# Patient Record
Sex: Female | Born: 1960 | Race: White | Hispanic: No | Marital: Married | Smoking: Never smoker
Health system: Southern US, Community
[De-identification: ages and names within clinical notes are randomized; demographics above are authoritative.]

## PROBLEM LIST (undated history)

## (undated) DIAGNOSIS — J302 Other seasonal allergic rhinitis: Secondary | ICD-10-CM

## (undated) DIAGNOSIS — I10 Essential (primary) hypertension: Secondary | ICD-10-CM

## (undated) DIAGNOSIS — E119 Type 2 diabetes mellitus without complications: Secondary | ICD-10-CM

## (undated) HISTORY — PX: CHOLECYSTECTOMY: SHX55

---

## 2007-02-22 ENCOUNTER — Ambulatory Visit: Payer: Self-pay | Admitting: Family Medicine

## 2007-03-29 ENCOUNTER — Ambulatory Visit: Payer: Self-pay | Admitting: Family Medicine

## 2007-04-05 ENCOUNTER — Ambulatory Visit: Payer: Self-pay | Admitting: Family Medicine

## 2007-04-15 ENCOUNTER — Ambulatory Visit: Payer: Self-pay | Admitting: Family Medicine

## 2007-05-15 ENCOUNTER — Ambulatory Visit: Payer: Self-pay | Admitting: Family Medicine

## 2007-06-15 ENCOUNTER — Ambulatory Visit: Payer: Self-pay | Admitting: Family Medicine

## 2007-11-14 ENCOUNTER — Emergency Department: Payer: Self-pay | Admitting: Emergency Medicine

## 2009-03-12 ENCOUNTER — Ambulatory Visit: Payer: Self-pay | Admitting: Family Medicine

## 2010-11-08 ENCOUNTER — Ambulatory Visit: Payer: Self-pay | Admitting: Family Medicine

## 2011-03-03 DIAGNOSIS — J301 Allergic rhinitis due to pollen: Secondary | ICD-10-CM | POA: Insufficient documentation

## 2011-03-03 DIAGNOSIS — E785 Hyperlipidemia, unspecified: Secondary | ICD-10-CM | POA: Insufficient documentation

## 2012-03-26 DIAGNOSIS — L309 Dermatitis, unspecified: Secondary | ICD-10-CM | POA: Insufficient documentation

## 2012-04-30 DIAGNOSIS — E1129 Type 2 diabetes mellitus with other diabetic kidney complication: Secondary | ICD-10-CM | POA: Insufficient documentation

## 2013-07-01 ENCOUNTER — Ambulatory Visit: Payer: Self-pay | Admitting: Family Medicine

## 2015-07-06 ENCOUNTER — Other Ambulatory Visit: Payer: Self-pay | Admitting: Nurse Practitioner

## 2015-07-06 DIAGNOSIS — Z1231 Encounter for screening mammogram for malignant neoplasm of breast: Secondary | ICD-10-CM

## 2015-08-10 ENCOUNTER — Ambulatory Visit
Admission: RE | Admit: 2015-08-10 | Discharge: 2015-08-10 | Disposition: A | Discharge status: Home / Self Care | Payer: BLUE CROSS/BLUE SHIELD | Source: Ambulatory Visit | Attending: Nurse Practitioner | Admitting: Nurse Practitioner

## 2015-08-10 DIAGNOSIS — Z1231 Encounter for screening mammogram for malignant neoplasm of breast: Secondary | ICD-10-CM | POA: Diagnosis present

## 2015-10-28 DIAGNOSIS — E669 Obesity, unspecified: Secondary | ICD-10-CM | POA: Insufficient documentation

## 2015-10-28 DIAGNOSIS — F419 Anxiety disorder, unspecified: Secondary | ICD-10-CM | POA: Insufficient documentation

## 2015-10-28 DIAGNOSIS — E119 Type 2 diabetes mellitus without complications: Secondary | ICD-10-CM | POA: Insufficient documentation

## 2016-08-30 DIAGNOSIS — J189 Pneumonia, unspecified organism: Secondary | ICD-10-CM | POA: Insufficient documentation

## 2017-01-12 DIAGNOSIS — I10 Essential (primary) hypertension: Secondary | ICD-10-CM | POA: Insufficient documentation

## 2017-02-02 DIAGNOSIS — J309 Allergic rhinitis, unspecified: Secondary | ICD-10-CM | POA: Insufficient documentation

## 2017-02-02 DIAGNOSIS — J4530 Mild persistent asthma, uncomplicated: Secondary | ICD-10-CM | POA: Insufficient documentation

## 2017-02-02 DIAGNOSIS — J329 Chronic sinusitis, unspecified: Secondary | ICD-10-CM | POA: Insufficient documentation

## 2017-11-26 ENCOUNTER — Other Ambulatory Visit: Payer: Self-pay | Admitting: Family Medicine

## 2017-11-26 DIAGNOSIS — Z1239 Encounter for other screening for malignant neoplasm of breast: Secondary | ICD-10-CM

## 2017-12-14 ENCOUNTER — Ambulatory Visit
Admission: RE | Admit: 2017-12-14 | Discharge: 2017-12-14 | Disposition: A | Discharge status: Home / Self Care | Payer: BLUE CROSS/BLUE SHIELD | Source: Ambulatory Visit | Attending: Family Medicine | Admitting: Family Medicine

## 2017-12-14 DIAGNOSIS — Z1231 Encounter for screening mammogram for malignant neoplasm of breast: Secondary | ICD-10-CM | POA: Diagnosis present

## 2017-12-14 DIAGNOSIS — Z1239 Encounter for other screening for malignant neoplasm of breast: Secondary | ICD-10-CM

## 2019-02-11 ENCOUNTER — Other Ambulatory Visit: Payer: Self-pay | Admitting: Family Medicine

## 2019-02-11 DIAGNOSIS — Z1231 Encounter for screening mammogram for malignant neoplasm of breast: Secondary | ICD-10-CM

## 2019-04-11 ENCOUNTER — Ambulatory Visit
Admission: RE | Admit: 2019-04-11 | Discharge: 2019-04-11 | Disposition: A | Discharge status: Home / Self Care | Payer: BC Managed Care – PPO | Source: Ambulatory Visit | Attending: Family Medicine | Admitting: Family Medicine

## 2019-04-11 DIAGNOSIS — Z1231 Encounter for screening mammogram for malignant neoplasm of breast: Secondary | ICD-10-CM | POA: Insufficient documentation

## 2019-12-19 ENCOUNTER — Other Ambulatory Visit: Payer: Self-pay | Admitting: Family Medicine

## 2019-12-19 DIAGNOSIS — Z1231 Encounter for screening mammogram for malignant neoplasm of breast: Secondary | ICD-10-CM

## 2020-04-20 ENCOUNTER — Ambulatory Visit
Admission: RE | Admit: 2020-04-20 | Discharge: 2020-04-20 | Disposition: A | Discharge status: Home / Self Care | Payer: BC Managed Care – PPO | Source: Ambulatory Visit | Attending: Family Medicine | Admitting: Family Medicine

## 2020-04-20 DIAGNOSIS — Z1231 Encounter for screening mammogram for malignant neoplasm of breast: Secondary | ICD-10-CM | POA: Diagnosis not present

## 2020-04-26 ENCOUNTER — Encounter: Payer: Self-pay | Admitting: Emergency Medicine

## 2020-04-26 ENCOUNTER — Ambulatory Visit
Admission: EM | Admit: 2020-04-26 | Discharge: 2020-04-26 | Disposition: A | Discharge status: Home / Self Care | Payer: BC Managed Care – PPO | Attending: Internal Medicine | Admitting: Internal Medicine

## 2020-04-26 ENCOUNTER — Other Ambulatory Visit: Payer: Self-pay

## 2020-04-26 DIAGNOSIS — J309 Allergic rhinitis, unspecified: Secondary | ICD-10-CM

## 2020-04-26 DIAGNOSIS — Z7982 Long term (current) use of aspirin: Secondary | ICD-10-CM | POA: Insufficient documentation

## 2020-04-26 DIAGNOSIS — J029 Acute pharyngitis, unspecified: Secondary | ICD-10-CM | POA: Diagnosis not present

## 2020-04-26 DIAGNOSIS — R110 Nausea: Secondary | ICD-10-CM | POA: Diagnosis not present

## 2020-04-26 DIAGNOSIS — J45909 Unspecified asthma, uncomplicated: Secondary | ICD-10-CM | POA: Insufficient documentation

## 2020-04-26 DIAGNOSIS — Z794 Long term (current) use of insulin: Secondary | ICD-10-CM | POA: Insufficient documentation

## 2020-04-26 DIAGNOSIS — R519 Headache, unspecified: Secondary | ICD-10-CM | POA: Insufficient documentation

## 2020-04-26 DIAGNOSIS — R059 Cough, unspecified: Secondary | ICD-10-CM

## 2020-04-26 DIAGNOSIS — E119 Type 2 diabetes mellitus without complications: Secondary | ICD-10-CM | POA: Insufficient documentation

## 2020-04-26 DIAGNOSIS — Z79899 Other long term (current) drug therapy: Secondary | ICD-10-CM | POA: Diagnosis not present

## 2020-04-26 DIAGNOSIS — R05 Cough: Secondary | ICD-10-CM | POA: Insufficient documentation

## 2020-04-26 DIAGNOSIS — Z20822 Contact with and (suspected) exposure to covid-19: Secondary | ICD-10-CM | POA: Diagnosis not present

## 2020-04-26 DIAGNOSIS — R0982 Postnasal drip: Secondary | ICD-10-CM | POA: Insufficient documentation

## 2020-04-26 DIAGNOSIS — I10 Essential (primary) hypertension: Secondary | ICD-10-CM | POA: Diagnosis not present

## 2020-04-26 DIAGNOSIS — R0981 Nasal congestion: Secondary | ICD-10-CM

## 2020-04-26 HISTORY — DX: Other seasonal allergic rhinitis: J30.2

## 2020-04-26 HISTORY — DX: Essential (primary) hypertension: I10

## 2020-04-26 HISTORY — DX: Type 2 diabetes mellitus without complications: E11.9

## 2020-04-26 LAB — SARS CORONAVIRUS 2 (TAT 6-24 HRS): SARS Coronavirus 2: NEGATIVE

## 2020-04-26 LAB — GROUP A STREP BY PCR: Group A Strep by PCR: NOT DETECTED

## 2020-04-26 MED ORDER — MONTELUKAST SODIUM 10 MG PO TABS: 10.0000 mg | ORAL_TABLET | Freq: Every day | ORAL | 0 refills | Status: AC

## 2020-04-26 MED ORDER — ALBUTEROL SULFATE HFA 108 (90 BASE) MCG/ACT IN AERS: 1.0000 | INHALATION_SPRAY | Freq: Four times a day (QID) | RESPIRATORY_TRACT | 0 refills | Status: AC | PRN

## 2020-04-26 MED ORDER — BENZONATATE 100 MG PO CAPS: 100.0000 mg | ORAL_CAPSULE | Freq: Three times a day (TID) | ORAL | 0 refills | Status: AC

## 2020-04-26 MED ORDER — AZELASTINE HCL 0.1 % NA SOLN: 1.0000 | Freq: Two times a day (BID) | NASAL | 0 refills | Status: AC

## 2020-04-26 NOTE — ED Provider Notes (Signed)
[facility]    CSN: [id number] Arrival date & time: 04/26/20  0801      History   Chief Complaint Chief Complaint  Patient presents with   Nausea   Cough   Headache   Sore Throat    HPI Sabrina Gutierrez is a 59 y.o. female. Patient presents for 3 day history of cough, fatigue, sore throat and headaches.  She also points to postnasal drainage and sinus pressure and pain.  She says she was recently around a large group of people who were not wearing masks and believes she could have been exposed to Covid. Patient is fully vaccinated. Her past medical history is significant for T2DM and mild asthma.  Patient states that her symptoms could be consistent with allergy flareup which often lead to acute sinusitis and allergic bronchitis.  She states there are couple medications to help her whenever this happens.  She lists Tessalon Perles, albuterol nebulizer, Astelin nasal spray, and Singulair.  She says she takes daily antihistamine and uses Flonase and that does not seem to be helping.  Patient denies fever, weakness, severe cough or breathing difficulty.  She has no other concerns today.  HPI  Past Medical History:  Diagnosis Date   Diabetes mellitus without complication (HCC)    Hypertension    Seasonal allergies     There are no problems to display for this patient.   Past Surgical History:  Procedure Laterality Date   CESAREAN SECTION     CHOLECYSTECTOMY      OB History   No obstetric history on file.      Home Medications    Prior to Admission medications   Medication Sig Start Date End Date Taking? Authorizing Provider  ADVAIR DISKUS 250-50 MCG/DOSE AEPB Inhale 1 puff into the lungs 2 (two) times daily. 02/16/20  Yes [provider]  aspirin 81 MG EC tablet Take 1 tablet by mouth daily.   Yes [provider]  BIOTIN PO Take by mouth.   Yes [provider]  Calcium Carb-Cholecalciferol (CALCIUM 600/VITAMIN D3 PO) Take  by mouth.   Yes [provider]  ELDERBERRY PO Take 50 mg by mouth daily.   Yes [provider]  fexofenadine (ALLEGRA) 180 MG tablet Take 1 tablet by mouth daily. 05/25/16  Yes [provider]  fluticasone (FLONASE) 50 MCG/ACT nasal spray Place 1 spray into both nostrils 2 (two) times daily. 02/16/20  Yes [provider]  glipiZIDE (GLUCOTROL XL) 10 MG 24 hr tablet Take 1 tablet by mouth 2 (two) times daily. 01/12/17 09/11/20 Yes [provider]  Glucosamine-Chondroitin (GLUCOSAMINE CHONDR COMPLEX PO) Take 1 tablet by mouth daily.   Yes [provider]  insulin glargine (LANTUS SOLOSTAR) 100 UNIT/ML Solostar Pen Inject 24 Units into the skin at bedtime. 07/19/15 04/07/21 Yes [provider]  losartan (COZAAR) 25 MG tablet Take 25 mg by mouth daily. 04/13/20  Yes [provider]  LUTEIN PO Take by mouth.   Yes [provider]  metFORMIN (GLUCOPHAGE) 500 MG tablet Take one tablet by mouth in the am, 2 tablets by mouth in the afternoon, and one tablet by mouth in the pm 01/12/17  Yes [provider]  Multiple Vitamin (MULTI-VITAMIN) tablet Take 1 tablet by mouth daily.   Yes [provider]  pravastatin (PRAVACHOL) 80 MG tablet Take 80 mg by mouth daily. 04/13/20  Yes [provider]  Turmeric Curcumin 500 MG CAPS Take by mouth.   Yes  [provider]  albuterol (VENTOLIN HFA) 108 (90 Base) MCG/ACT inhaler Inhale 1-2 puffs into the lungs every 6 (six) hours as needed for wheezing or shortness of breath. 04/26/20 05/26/20  Eusebio Friendly B, PA-C  azelastine (ASTELIN) 0.1 % nasal spray Place 1 spray into both nostrils 2 (two) times daily. Use in each nostril as directed 04/26/20 05/26/20  Eusebio Friendly B, PA-C  benzonatate (TESSALON) 100 MG capsule Take 1 capsule (100 mg total) by mouth every 8 (eight) hours for 10 days. 04/26/20 05/06/20  Eusebio Friendly B, PA-C  montelukast (SINGULAIR) 10 MG tablet Take 1  tablet (10 mg total) by mouth at bedtime. 04/26/20 05/26/20  Shirlee Latch, PA-C    Family History Family History  Problem Relation Age of Onset   Stomach cancer Mother    Diabetes Mother    Hypertension Mother    Diabetes Father    Breast cancer Neg Hx     Social History Social History   Tobacco Use   Smoking status: Never Smoker   Smokeless tobacco: Never Used  Building services engineer Use: Never used  Substance Use Topics   Alcohol use: Yes    Comment: rare   Drug use: Never     Allergies   Lisinopril and Molds & smuts   Review of Systems Review of Systems  Constitutional: Negative for chills, diaphoresis, fatigue and fever.  HENT: Positive for sore throat. Negative for congestion, ear pain, rhinorrhea, sinus pressure and sinus pain.   Respiratory: Positive for cough. Negative for shortness of breath.   Gastrointestinal: Positive for nausea. Negative for abdominal pain and vomiting.  Musculoskeletal: Negative for arthralgias and myalgias.  Skin: Negative for rash.  Neurological: Positive for headaches. Negative for weakness.  Hematological: Negative for adenopathy.     Physical Exam Triage Vital Signs ED Triage Vitals  Enc Vitals Group     BP      Pulse      Resp      Temp      Temp src      SpO2      Weight      Height      Head Circumference      Peak Flow      Pain Score      Pain Loc      Pain Edu?      Excl. in GC?    No data found.  Updated Vital Signs BP (!) 141/66 (BP Location: Left Arm)    Pulse 80    Temp 98.5 F (36.9 C) (Oral)    Resp 18    Ht 5' 4.5" (1.638 m)    Wt 220 lb (99.8 kg)    SpO2 99%    BMI 37.18 kg/m        Physical Exam Vitals and nursing note reviewed.  Constitutional:      General: She is not in acute distress.    Appearance: Normal appearance. She is not ill-appearing or toxic-appearing.  HENT:     Head: Normocephalic and atraumatic.     Nose: Congestion and rhinorrhea present.     Mouth/Throat:      Mouth: Mucous membranes are moist.     Pharynx: Oropharynx is clear. Posterior oropharyngeal erythema present.  Eyes:     General: No scleral icterus.       Right eye: No discharge.        Left eye: No discharge.     Conjunctiva/sclera: Conjunctivae normal.  Cardiovascular:     Rate and Rhythm: Normal rate and regular rhythm.     Heart sounds: Normal heart sounds.  Pulmonary:     Effort: Pulmonary effort is normal. No respiratory distress.     Breath sounds: Normal breath sounds.  Musculoskeletal:     Cervical back: Neck supple.  Skin:    General: Skin is dry.  Neurological:     General: No focal deficit present.     Mental Status: She is alert. Mental status is at baseline.     Motor: No weakness.     Gait: Gait normal.  Psychiatric:        Mood and Affect: Mood normal.        Behavior: Behavior normal.        Thought Content: Thought content normal.      UC Treatments / Results  Labs (all labs ordered are listed, but only abnormal results are displayed) Labs Reviewed  SARS CORONAVIRUS 2 (TAT 6-24 HRS)  GROUP A STREP BY PCR    EKG   Radiology No results found.  Procedures Procedures (including critical care time)  Medications Ordered in UC Medications - No data to display  Initial Impression / Assessment and Plan / UC Course  I have reviewed the triage vital signs and the nursing notes.  Pertinent labs & imaging results that were available during my care of the patient were reviewed by me and considered in my medical decision making (see chart for details).    Covid test performed.  Discussed CDC protocol, isolation protocol and ED precautions with patient.  Strep test performed negative result.  I did fill the medications that she requested in case the Covid test is negative and she is having an allergic flareup.  She states that she would be interested in the monoclonal antibody therapy if her Covid test is positive.  Advised patient we will call her with  results if positive.  Advised her to use the prescribed medications if Covid test is negative and follow-up with PCP if needed.   Final Clinical Impressions(s) / UC Diagnoses   Final diagnoses:  Cough  Sore throat  Nasal sinus congestion  Allergic rhinitis, unspecified seasonality, unspecified trigger     Discharge Instructions     CONGESTION: Your exam today is consistent with a viral illness vs allergies. Antibiotics are not indicated at this time. Use medications as directed, including cough syrup, nasal saline, and decongestants. Your symptoms should improve over the next few days and resolve within 7-10 days. Increase rest and fluids. F/u if symptoms worsen or predominate such as sore throat, ear pain, productive cough, shortness of breath, or if you develop high fevers or worsening fatigue over the next several days.    You have received COVID testing today either for positive exposure, concerning symptoms that could be related to COVID infection, screening purposes, or re-testing after confirmed positive.  Your test obtained today checks for active viral infection in the last 1-2 weeks. If your test is negative now, you can still test positive later. So, if you do develop symptoms you should either get re-tested and/or isolate x 10 days. Please follow CDC guidelines.  While Rapid antigen tests come back in 15-20 minutes, send out PCR/molecular test results typically come back within 24 hours. In the mean time, if you are symptomatic, assume this could be a positive test and treat/monitor yourself as if you do have COVID.   We will call with test results. Please download  the MyChart app and set up a profile to access test results.   If symptomatic, go home and rest. Push fluids. Take Tylenol as needed for discomfort. Gargle warm salt water. Throat lozenges. Take Mucinex DM or Robitussin for cough. Humidifier in bedroom to ease coughing. Warm showers. Also review the COVID handout for  more information.  COVID-19 INFECTION: The incubation period of COVID-19 is approximately 14 days after exposure, with most symptoms developing in roughly 4-5 days. Symptoms may range in severity from mild to critically severe. Roughly 80% of those infected will have mild symptoms. People of any age may become infected with COVID-19 and have the ability to transmit the virus. The most common symptoms include: fever, fatigue, cough, body aches, headaches, sore throat, nasal congestion, shortness of breath, nausea, vomiting, diarrhea, changes in smell and/or taste.    COURSE OF ILLNESS Some patients may begin with mild disease which can progress quickly into critical symptoms. If your symptoms are worsening please call ahead to the Emergency Department and proceed there for further treatment. Recovery time appears to be roughly 1-2 weeks for mild symptoms and 3-6 weeks for severe disease.   GO IMMEDIATELY TO ER FOR FEVER YOU ARE UNABLE TO GET DOWN WITH TYLENOL, BREATHING PROBLEMS, CHEST PAIN, FATIGUE, LETHARGY, INABILITY TO EAT OR DRINK, ETC  QUARANTINE AND ISOLATION: To help decrease the spread of COVID-19 please remain isolated if you have COVID infection or are highly suspected to have COVID infection. This means -stay home and isolate to one room in the home if you live with others. Do not share a bed or bathroom with others while ill, sanitize and wipe down all countertops and keep common areas clean and disinfected. You may discontinue isolation if you have a mild case and are asymptomatic 10 days after symptom onset as long as you have been fever free >24 hours without having to take Motrin or Tylenol. If your case is more severe (meaning you develop pneumonia or are admitted in the hospital), you may have to isolate longer.   If you have been in close contact (within 6 feet) of someone diagnosed with COVID 19, you are advised to quarantine in your home for 14 days as symptoms can develop anywhere  from 2-14 days after exposure to the virus. If you develop symptoms, you  must isolate.  Most current guidelines for COVID after exposure -isolate 10 days if you ARE NOT tested for COVID as long as symptoms do not develop -isolate 7 days if you are tested and remain asymptomatic -You do not necessarily need to be tested for COVID if you have + exposure and        develop   symptoms. Just isolate at home x10 days from symptom onset During this global pandemic, CDC advises to practice social distancing, try to stay at least 62ft away from others at all times. Wear a face covering. Wash and sanitize your hands regularly and avoid going anywhere that is not necessary.  KEEP IN MIND THAT THE COVID TEST IS NOT 100% ACCURATE AND YOU SHOULD STILL DO EVERYTHING TO PREVENT POTENTIAL SPREAD OF VIRUS TO OTHERS (WEAR MASK, WEAR GLOVES, WASH HANDS AND SANITIZE REGULARLY). IF INITIAL TEST IS NEGATIVE, THIS MAY NOT MEAN YOU ARE DEFINITELY NEGATIVE. MOST ACCURATE TESTING IS DONE 5-7 DAYS AFTER EXPOSURE.   It is not advised by CDC to get re-tested after receiving a positive COVID test since you can still test positive for weeks to months after you have already  cleared the virus.   *If you have not been vaccinated for COVID, I strongly suggest you consider getting vaccinated as long as there are no contraindications.      ED Prescriptions    Medication Sig Dispense Auth. Provider   montelukast (SINGULAIR) 10 MG tablet Take 1 tablet (10 mg total) by mouth at bedtime. 30 tablet Eusebio Friendly B, PA-C   azelastine (ASTELIN) 0.1 % nasal spray Place 1 spray into both nostrils 2 (two) times daily. Use in each nostril as directed 9 mL Eusebio Friendly B, PA-C   albuterol (VENTOLIN HFA) 108 (90 Base) MCG/ACT inhaler Inhale 1-2 puffs into the lungs every 6 (six) hours as needed for wheezing or shortness of breath. 1 g Eusebio Friendly B, PA-C   benzonatate (TESSALON) 100 MG capsule Take 1 capsule (100 mg total) by mouth every 8  (eight) hours for 10 days. 30 capsule Shirlee Latch, PA-C     PDMP not reviewed this encounter.   Shirlee Latch, PA-C 04/26/20 [phone]

## 2020-04-26 NOTE — Discharge Instructions (Addendum)
CONGESTION: Your exam today is consistent with a viral illness vs allergies. Antibiotics are not indicated at this time. Use medications as directed, including cough syrup, nasal saline, and decongestants. Your symptoms should improve over the next few days and resolve within 7-10 days. Increase rest and fluids. F/u if symptoms worsen or predominate such as sore throat, ear pain, productive cough, shortness of breath, or if you develop high fevers or worsening fatigue over the next several days.    You have received COVID testing today either for positive exposure, concerning symptoms that could be related to COVID infection, screening purposes, or re-testing after confirmed positive.  Your test obtained today checks for active viral infection in the last 1-2 weeks. If your test is negative now, you can still test positive later. So, if you do develop symptoms you should either get re-tested and/or isolate x 10 days. Please follow CDC guidelines.  While Rapid antigen tests come back in 15-20 minutes, send out PCR/molecular test results typically come back within 24 hours. In the mean time, if you are symptomatic, assume this could be a positive test and treat/monitor yourself as if you do have COVID.   We will call with test results. Please download the MyChart app and set up a profile to access test results.   If symptomatic, go home and rest. Push fluids. Take Tylenol as needed for discomfort. Gargle warm salt water. Throat lozenges. Take Mucinex DM or Robitussin for cough. Humidifier in bedroom to ease coughing. Warm showers. Also review the COVID handout for more information.  COVID-19 INFECTION: The incubation period of COVID-19 is approximately 14 days after exposure, with most symptoms developing in roughly 4-5 days. Symptoms may range in severity from mild to critically severe. Roughly 80% of those infected will have mild symptoms. People of any age may become infected with COVID-19 and have the  ability to transmit the virus. The most common symptoms include: fever, fatigue, cough, body aches, headaches, sore throat, nasal congestion, shortness of breath, nausea, vomiting, diarrhea, changes in smell and/or taste.    COURSE OF ILLNESS Some patients may begin with mild disease which can progress quickly into critical symptoms. If your symptoms are worsening please call ahead to the Emergency Department and proceed there for further treatment. Recovery time appears to be roughly 1-2 weeks for mild symptoms and 3-6 weeks for severe disease.   GO IMMEDIATELY TO ER FOR FEVER YOU ARE UNABLE TO GET DOWN WITH TYLENOL, BREATHING PROBLEMS, CHEST PAIN, FATIGUE, LETHARGY, INABILITY TO EAT OR DRINK, ETC  QUARANTINE AND ISOLATION: To help decrease the spread of COVID-19 please remain isolated if you have COVID infection or are highly suspected to have COVID infection. This means -stay home and isolate to one room in the home if you live with others. Do not share a bed or bathroom with others while ill, sanitize and wipe down all countertops and keep common areas clean and disinfected. You may discontinue isolation if you have a mild case and are asymptomatic 10 days after symptom onset as long as you have been fever free >24 hours without having to take Motrin or Tylenol. If your case is more severe (meaning you develop pneumonia or are admitted in the hospital), you may have to isolate longer.   If you have been in close contact (within 6 feet) of someone diagnosed with COVID 19, you are advised to quarantine in your home for 14 days as symptoms can develop anywhere from 2-14 days after exposure to  the virus. If you develop symptoms, you  must isolate.  Most current guidelines for COVID after exposure -isolate 10 days if you ARE NOT tested for COVID as long as symptoms do not develop -isolate 7 days if you are tested and remain asymptomatic -You do not necessarily need to be tested for COVID if you have +  exposure and        develop   symptoms. Just isolate at home x10 days from symptom onset During this global pandemic, CDC advises to practice social distancing, try to stay at least 57ft away from others at all times. Wear a face covering. Wash and sanitize your hands regularly and avoid going anywhere that is not necessary.  KEEP IN MIND THAT THE COVID TEST IS NOT 100% ACCURATE AND YOU SHOULD STILL DO EVERYTHING TO PREVENT POTENTIAL SPREAD OF VIRUS TO OTHERS (WEAR MASK, WEAR GLOVES, WASH HANDS AND SANITIZE REGULARLY). IF INITIAL TEST IS NEGATIVE, THIS MAY NOT MEAN YOU ARE DEFINITELY NEGATIVE. MOST ACCURATE TESTING IS DONE 5-7 DAYS AFTER EXPOSURE.   It is not advised by CDC to get re-tested after receiving a positive COVID test since you can still test positive for weeks to months after you have already cleared the virus.   *If you have not been vaccinated for COVID, I strongly suggest you consider getting vaccinated as long as there are no contraindications.

## 2020-04-26 NOTE — ED Triage Notes (Signed)
Patient in today c/o cough, headache and nausea x 3 days. Patient has completed the Pfizer covid vaccines. Patient denies fever. Patient has taken OTC Tylenol, last dose ~6am today.

## 2020-04-26 NOTE — ED Triage Notes (Signed)
Patient also c/o sore throat.

## 2021-03-31 ENCOUNTER — Other Ambulatory Visit: Payer: Self-pay | Admitting: Family Medicine

## 2021-03-31 DIAGNOSIS — Z1231 Encounter for screening mammogram for malignant neoplasm of breast: Secondary | ICD-10-CM

## 2021-05-06 ENCOUNTER — Other Ambulatory Visit: Payer: Self-pay

## 2021-05-06 ENCOUNTER — Ambulatory Visit
Admission: RE | Admit: 2021-05-06 | Discharge: 2021-05-06 | Disposition: A | Discharge status: Home / Self Care | Payer: BC Managed Care – PPO | Source: Ambulatory Visit | Attending: Family Medicine | Admitting: Family Medicine

## 2021-05-06 DIAGNOSIS — Z1231 Encounter for screening mammogram for malignant neoplasm of breast: Secondary | ICD-10-CM | POA: Diagnosis present

## 2021-05-09 ENCOUNTER — Ambulatory Visit: Payer: BC Managed Care – PPO

## 2021-07-05 LAB — COLOGUARD: COLOGUARD: NEGATIVE

## 2021-07-05 LAB — EXTERNAL GENERIC LAB PROCEDURE: COLOGUARD: NEGATIVE

## 2021-08-21 ENCOUNTER — Telehealth: Payer: Self-pay | Admitting: Physician Assistant

## 2021-08-21 ENCOUNTER — Encounter: Payer: Self-pay | Admitting: Emergency Medicine

## 2021-08-21 ENCOUNTER — Ambulatory Visit
Admission: EM | Admit: 2021-08-21 | Discharge: 2021-08-21 | Disposition: A | Discharge status: Home / Self Care | Payer: BC Managed Care – PPO | Attending: Physician Assistant | Admitting: Physician Assistant

## 2021-08-21 ENCOUNTER — Other Ambulatory Visit: Payer: Self-pay

## 2021-08-21 DIAGNOSIS — J069 Acute upper respiratory infection, unspecified: Secondary | ICD-10-CM | POA: Diagnosis not present

## 2021-08-21 DIAGNOSIS — I10 Essential (primary) hypertension: Secondary | ICD-10-CM | POA: Diagnosis not present

## 2021-08-21 DIAGNOSIS — Z7951 Long term (current) use of inhaled steroids: Secondary | ICD-10-CM | POA: Insufficient documentation

## 2021-08-21 DIAGNOSIS — J309 Allergic rhinitis, unspecified: Secondary | ICD-10-CM | POA: Diagnosis not present

## 2021-08-21 DIAGNOSIS — E119 Type 2 diabetes mellitus without complications: Secondary | ICD-10-CM | POA: Insufficient documentation

## 2021-08-21 DIAGNOSIS — R0981 Nasal congestion: Secondary | ICD-10-CM | POA: Insufficient documentation

## 2021-08-21 DIAGNOSIS — Z20822 Contact with and (suspected) exposure to covid-19: Secondary | ICD-10-CM | POA: Diagnosis not present

## 2021-08-21 DIAGNOSIS — R059 Cough, unspecified: Secondary | ICD-10-CM | POA: Diagnosis present

## 2021-08-21 MED ORDER — PREDNISONE 20 MG PO TABS: 40.0000 mg | ORAL_TABLET | Freq: Every day | ORAL | 0 refills | Status: AC

## 2021-08-21 MED ORDER — BENZONATATE 200 MG PO CAPS: 200.0000 mg | ORAL_CAPSULE | Freq: Three times a day (TID) | ORAL | 0 refills | Status: DC | PRN

## 2021-08-21 NOTE — Discharge Instructions (Signed)
-  It sounds like you have picked up a virus on the cruise ship. - Perform another COVID test tomorrow.  If you are positive you need to isolate 5 days and wear mask for 5 days. - Increase rest and fluid intake and continue your at home medications and inhalers. - I sent the [facility] for your cough.  I also prescribed prednisone in case you need that.  If not feeling better in a couple days you can start the prednisone.  Can start it sooner if you are feeling worse but you need to keep a check on your blood sugars because it can elevate it. - If you are feeling worse and you still think it any antibiotic, contact your allergy specialist but based on your exam today symptoms do not seem consistent with bacterial infection and antibiotics not indicated at this time.

## 2021-08-21 NOTE — ED Triage Notes (Signed)
Pt c/o cough, sore throat, and post nasal drip. Started about 3 days ago. She states she has allergy bronchitis and needs an antibiotic before it gets worse. She states she took a home covid test and was negative.

## 2021-08-21 NOTE — ED Provider Notes (Addendum)
[facility]    CSN: [id number] Arrival date & time: 08/21/21  0810      History   Chief Complaint Chief Complaint  Patient presents with   Cough    HPI Sabrina Gutierrez is a 61 y.o. female presenting for 3-day history of cough, sore throat and postnasal drainage, nasal congestion and fatigue.  Patient reports she just came back from a cruise.  Reports taking a COVID test yesterday and it was negative.  She reports a history of allergic bronchitis and sinusitis.  Reports she has been taking her antihistamines and using inhalers including Breo and albuterol as prescribed.  Also uses nasal sprays.  Reports she needs an antibiotic and specifically Biaxin before symptoms worsen.  Denies associated fever or breathing trouble.  No vomiting or diarrhea.  No sick contacts or known exposure to COVID or flu.  Fully vaccinated for COVID and flu.  History of diabetes, hypertension, and allergies.  HPI  Past Medical History:  Diagnosis Date   Diabetes mellitus without complication ([place])    Hypertension    Seasonal allergies     There are no problems to display for this patient.   Past Surgical History:  Procedure Laterality Date   CESAREAN SECTION     CHOLECYSTECTOMY      OB History   No obstetric history on file.      Home Medications    Prior to Admission medications   Medication Sig Start Date End Date Taking? Authorizing Provider  ADVAIR DISKUS 250-50 MCG/DOSE AEPB Inhale 1 puff into the lungs 2 (two) times daily. 02/16/20  Yes [provider]  albuterol (VENTOLIN HFA) 108 (90 Base) MCG/ACT inhaler Inhale 1-2 puffs into the lungs every 6 (six) hours as needed for wheezing or shortness of breath. 04/26/20 08/21/21 Yes Danton Clap, PA-C  aspirin 81 MG EC tablet Take 1 tablet by mouth daily.   Yes [provider]  azelastine (ASTELIN) 0.1 % nasal spray Place 1 spray into both nostrils 2 (two) times daily. Use in each nostril as directed 04/26/20 08/21/21  Yes Danton Clap, PA-C  benzonatate (TESSALON) 200 MG capsule Take 1 capsule (200 mg total) by mouth 3 (three) times daily as needed for cough. 08/21/21  Yes Laurene Footman B, PA-C  BIOTIN PO Take by mouth.   Yes [provider]  Calcium Carb-Cholecalciferol (CALCIUM 600/VITAMIN D3 PO) Take by mouth.   Yes [provider]  ELDERBERRY PO Take 50 mg by mouth daily.   Yes [provider]  fexofenadine (ALLEGRA) 180 MG tablet Take 1 tablet by mouth daily. 05/25/16  Yes [provider]  fluticasone (FLONASE) 50 MCG/ACT nasal spray Place 1 spray into both nostrils 2 (two) times daily. 02/16/20  Yes [provider]  glipiZIDE (GLUCOTROL XL) 10 MG 24 hr tablet Take 1 tablet by mouth 2 (two) times daily. 01/12/17 08/21/21 Yes [provider]  Glucosamine-Chondroitin (GLUCOSAMINE CHONDR COMPLEX PO) Take 1 tablet by mouth daily.   Yes [provider]  losartan (COZAAR) 25 MG tablet Take 25 mg by mouth daily. 04/13/20  Yes [provider]  LUTEIN PO Take by mouth.   Yes [provider]  metFORMIN (GLUCOPHAGE) 500 MG tablet Take one tablet by mouth in the am, 2 tablets by mouth in the afternoon, and one tablet by mouth in the pm 01/12/17  Yes [provider]  Multiple Vitamin (MULTI-VITAMIN) tablet Take 1 tablet by mouth daily.   Yes [provider]  pravastatin (PRAVACHOL) 80 MG tablet Take 80 mg by mouth daily. 04/13/20  Yes [provider]  predniSONE (DELTASONE) 20 MG tablet Take 2 tablets (40 mg total) by mouth daily for 5 days. 08/21/21 08/26/21 Yes Leatta Alewine B, PA-C  SEMGLEE, YFGN, 100 UNIT/ML Pen Inject into the skin. 08/17/21  Yes [provider]  Turmeric Curcumin 500 MG CAPS Take by mouth.   Yes [provider]  insulin glargine (LANTUS SOLOSTAR) 100 UNIT/ML Solostar Pen Inject 24 Units into the skin at bedtime. 07/19/15 08/21/21  [provider]  montelukast (SINGULAIR) 10 MG  tablet Take 1 tablet (10 mg total) by mouth at bedtime. 04/26/20 05/26/20  Danton Clap, PA-C    Family History Family History  Problem Relation Age of Onset   Stomach cancer Mother    Diabetes Mother    Hypertension Mother    Diabetes Father    Breast cancer Neg Hx     Social History Social History   Tobacco Use   Smoking status: Never   Smokeless tobacco: Never  Vaping Use   Vaping Use: Never used  Substance Use Topics   Alcohol use: Yes    Comment: rare   Drug use: Never     Allergies   Lisinopril and Molds & smuts   Review of Systems Review of Systems  Constitutional:  Positive for fatigue. Negative for chills, diaphoresis and fever.  HENT:  Positive for congestion, postnasal drip, rhinorrhea, sinus pressure and sore throat. Negative for ear pain and sinus pain.   Respiratory:  Positive for cough. Negative for shortness of breath and wheezing.   Cardiovascular:  Positive for chest pain (when coughing).  Gastrointestinal:  Negative for abdominal pain, nausea and vomiting.  Musculoskeletal:  Negative for arthralgias and myalgias.  Skin:  Negative for rash.  Neurological:  Negative for weakness and headaches.  Hematological:  Negative for adenopathy.  Psychiatric/Behavioral:  Positive for sleep disturbance (due to cough).     Physical Exam Triage Vital Signs ED Triage Vitals  Enc Vitals Group     BP      Pulse      Resp      Temp      Temp src      SpO2      Weight      Height      Head Circumference      Peak Flow      Pain Score      Pain Loc      Pain Edu?      Excl. in [place]?    No data found.  Updated Vital Signs BP (!) 158/87 (BP Location: Right Arm)    Pulse 77    Temp 98.6 F (37 C) (Oral)    Resp 18    Ht 5' 4.5" (1.638 m)    Wt 220 lb 0.3 oz (99.8 kg)    SpO2 98%    BMI 37.18 kg/m      Physical Exam Vitals and nursing note reviewed.  Constitutional:      General: She is not in acute distress.    Appearance: Normal appearance. She  is not ill-appearing or toxic-appearing.  HENT:     Head: Normocephalic and atraumatic.     Right Ear: Tympanic membrane, ear canal and external ear normal.     Left Ear: Tympanic membrane, ear canal and external ear normal.     Nose: Congestion and rhinorrhea present.     Mouth/Throat:  Mouth: Mucous membranes are moist.     Pharynx: Oropharynx is clear.  Eyes:     General: No scleral icterus.       Right eye: No discharge.        Left eye: No discharge.     Conjunctiva/sclera: Conjunctivae normal.  Cardiovascular:     Rate and Rhythm: Normal rate and regular rhythm.     Heart sounds: Normal heart sounds.  Pulmonary:     Effort: Pulmonary effort is normal. No respiratory distress.     Breath sounds: Normal breath sounds. No wheezing, rhonchi or rales.  Musculoskeletal:     Cervical back: Neck supple.  Skin:    General: Skin is dry.  Neurological:     General: No focal deficit present.     Mental Status: She is alert. Mental status is at baseline.     Motor: No weakness.     Coordination: Coordination normal.     Gait: Gait normal.  Psychiatric:        Mood and Affect: Mood normal.        Behavior: Behavior normal.        Thought Content: Thought content normal.     UC Treatments / Results  Labs (all labs ordered are listed, but only abnormal results are displayed) Labs Reviewed  SARS CORONAVIRUS 2 (TAT 6-24 HRS)    EKG   Radiology No results found.  Procedures Procedures (including critical care time)  Medications Ordered in UC Medications - No data to display  Initial Impression / Assessment and Plan / UC Course  I have reviewed the triage vital signs and the nursing notes.  Pertinent labs & imaging results that were available during my care of the patient were reviewed by me and considered in my medical decision making (see chart for details).  61 year old female with history of allergic rhinitis/sinusitis/bronchitis presenting for 3-day history of  cough, congestion, sore throat, postnasal drainage.  Patient reports recently getting back from a cruise.  Negative at home COVID test yesterday.  BP elevated 158/87.  She is afebrile.  She is overall well-appearing.  On exam she has nasal congestion and clear drainage.  Chest is clear to auscultation and heart regular rate and rhythm.    Discussed with patient that her symptoms are likely due to a viral illness.  She did just get off a cruise ship and was exposed to a large amount of people.  Advised her to do another home COVID test tomorrow and reviewed current CDC guidelines, isolation protocol and ED precautions if COVID test is positive.  Supportive care encouraged with increasing rest and fluids and continuing inhalers, nasal sprays, antihistamines and decongestants.  Sent benzonatate for cough at patient request.  Also printed prescription for prednisone in case symptoms were to worsen.  Advise contacting her allergist if she is feeling worse and they can prescribe antibiotics if they deem necessary or she can return for reevaluation here or with PCP.  Reviewed ED precautions.  At discharge, patient request to do PCR COVID test in clinic.  Reviewed how to access results.  Can send antiviral medicine if positive.  Final Clinical Impressions(s) / UC Diagnoses   Final diagnoses:  Viral URI with cough  Nasal congestion  Allergic rhinitis, unspecified seasonality, unspecified trigger     Discharge Instructions      -It sounds like you have picked up a virus on the cruise ship. - Perform another COVID test tomorrow.  If you are positive you  need to isolate 5 days and wear mask for 5 days. - Increase rest and fluid intake and continue your at home medications and inhalers. - I sent the [facility] for your cough.  I also prescribed prednisone in case you need that.  If not feeling better in a couple days you can start the prednisone.  Can start it sooner if you are feeling worse but you  need to keep a check on your blood sugars because it can elevate it. - If you are feeling worse and you still think it any antibiotic, contact your allergy specialist but based on your exam today symptoms do not seem consistent with bacterial infection and antibiotics not indicated at this time.     ED Prescriptions     Medication Sig Dispense Auth. Provider   benzonatate (TESSALON) 200 MG capsule Take 1 capsule (200 mg total) by mouth 3 (three) times daily as needed for cough. 2 capsule Laurene Footman B, PA-C   predniSONE (DELTASONE) 20 MG tablet Take 2 tablets (40 mg total) by mouth daily for 5 days. 10 tablet Gretta Cool      PDMP not reviewed this encounter.   Danton Clap, PA-C 08/21/21 0852    Danton Clap, PA-C 08/21/21 [phone]

## 2021-08-21 NOTE — Telephone Encounter (Signed)
Error on dose of benzonatate I sent earlier.  Sent new prescription

## 2021-08-22 LAB — SARS CORONAVIRUS 2 (TAT 6-24 HRS): SARS Coronavirus 2: NEGATIVE

## 2022-07-10 ENCOUNTER — Other Ambulatory Visit: Payer: Self-pay | Admitting: Family Medicine

## 2022-07-10 DIAGNOSIS — Z1231 Encounter for screening mammogram for malignant neoplasm of breast: Secondary | ICD-10-CM

## 2022-09-22 ENCOUNTER — Ambulatory Visit
Admission: RE | Admit: 2022-09-22 | Discharge: 2022-09-22 | Disposition: A | Discharge status: Home / Self Care | Payer: 59 | Source: Ambulatory Visit | Attending: Family Medicine | Admitting: Family Medicine

## 2022-09-22 DIAGNOSIS — Z1231 Encounter for screening mammogram for malignant neoplasm of breast: Secondary | ICD-10-CM | POA: Diagnosis present

## 2023-02-04 IMAGING — MG MM DIGITAL SCREENING BILAT W/ TOMO AND CAD
6 of 10 series · 6 of 30 positions shown · non-contrast
Comparison: Previous exam(s).

CLINICAL DATA: Screening.

EXAM:
DIGITAL SCREENING BILATERAL MAMMOGRAM WITH TOMOSYNTHESIS AND CAD
TECHNIQUE: Bilateral screening digital craniocaudal and mediolateral oblique
mammograms were obtained. Bilateral screening digital breast
tomosynthesis was performed. The images were evaluated with
computer-aided detection.

[R MLO synth-2D]
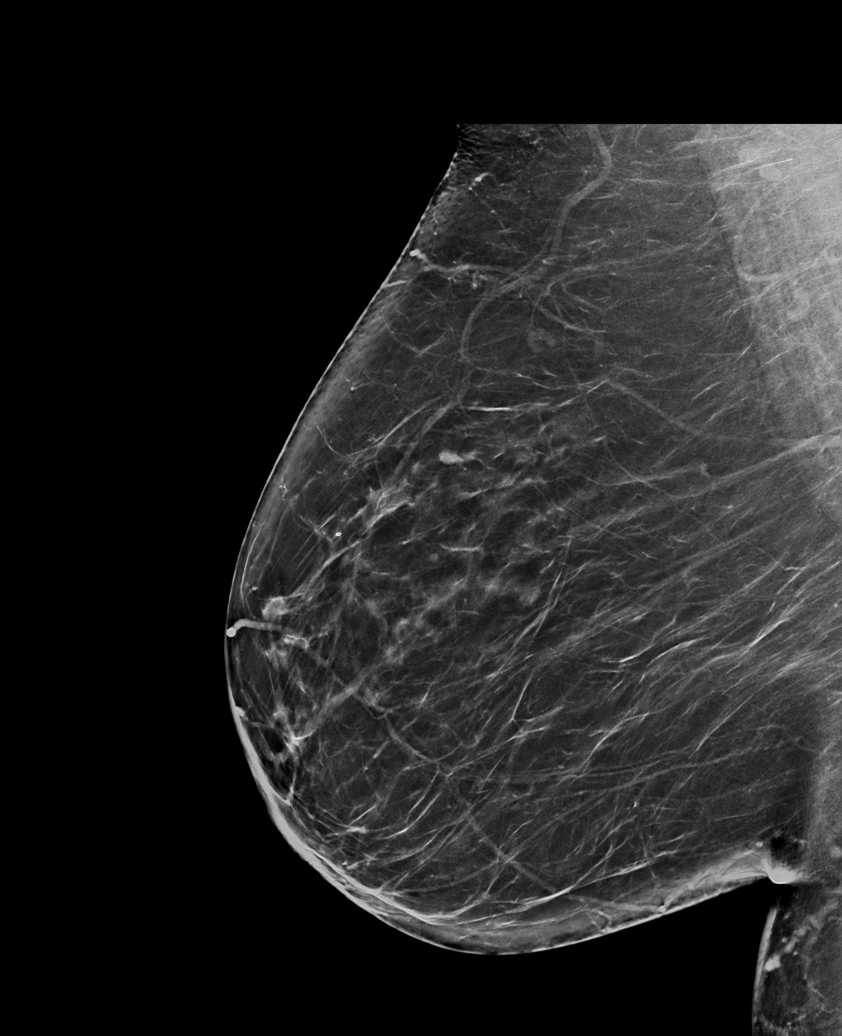

[L MLO synth-2D]
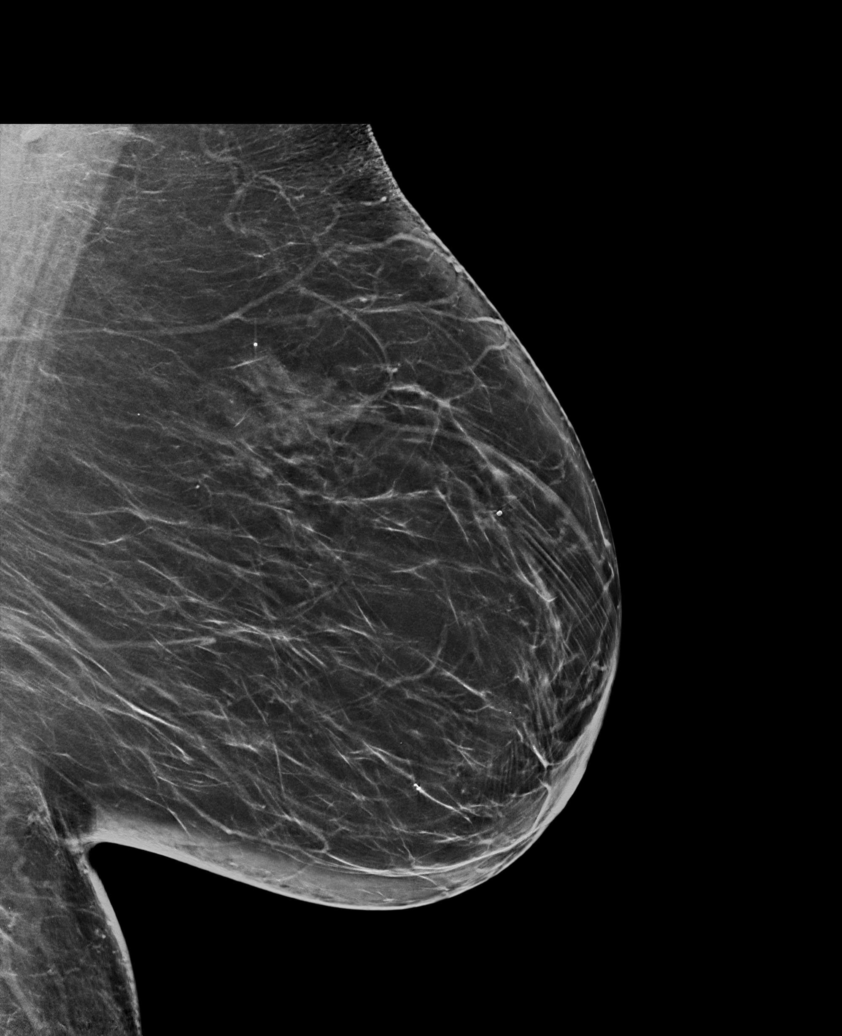

[L CC synth-2D (1 of 2)]
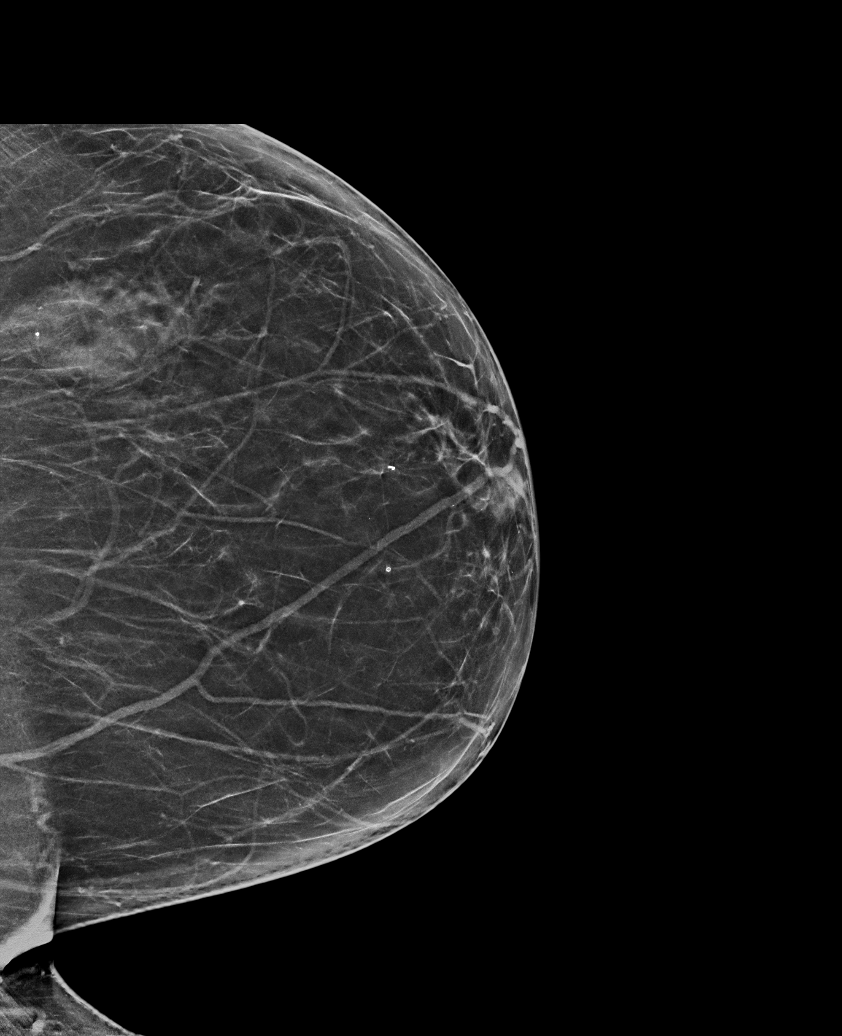

[R CC synth-2D]
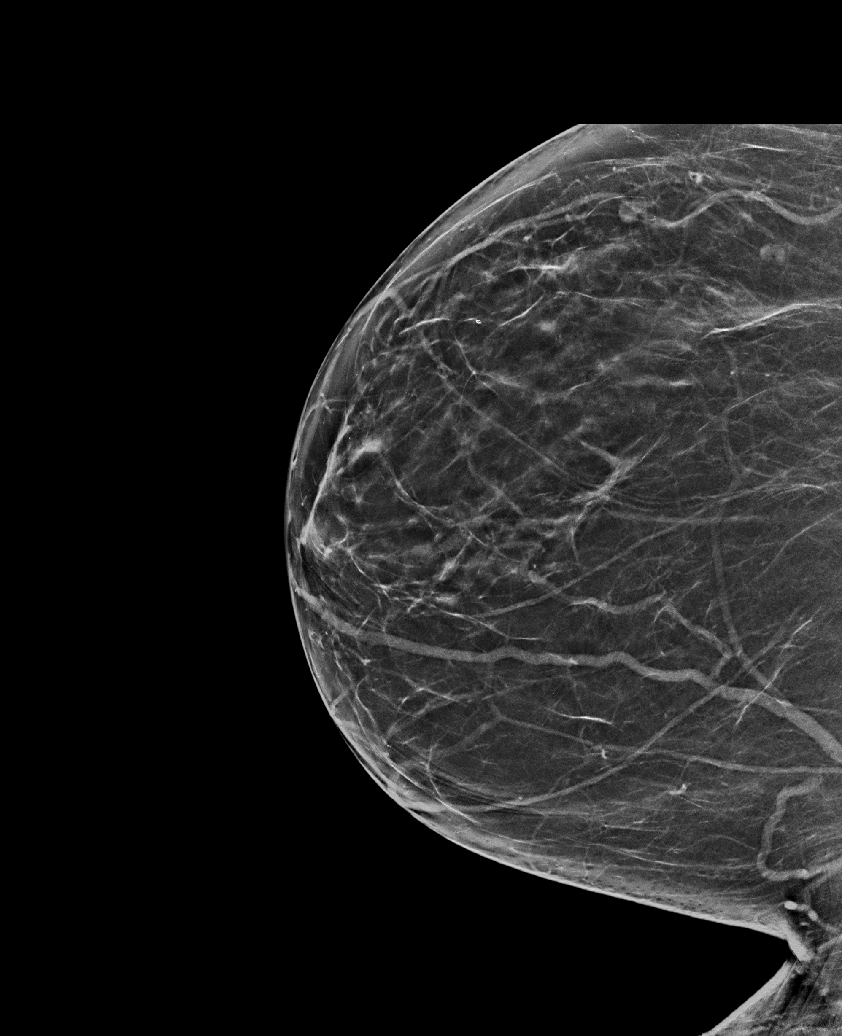

[L CC synth-2D (2 of 2)]
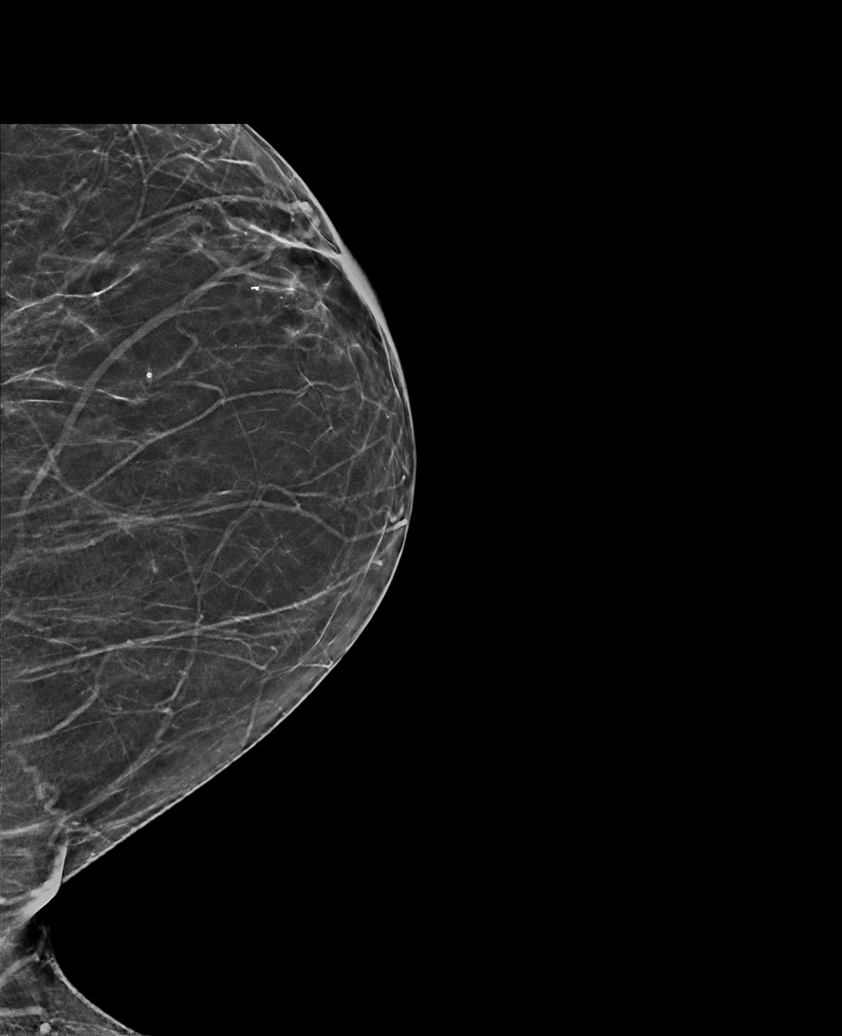

[R MLO tomo · tomo slice 41/82.0]
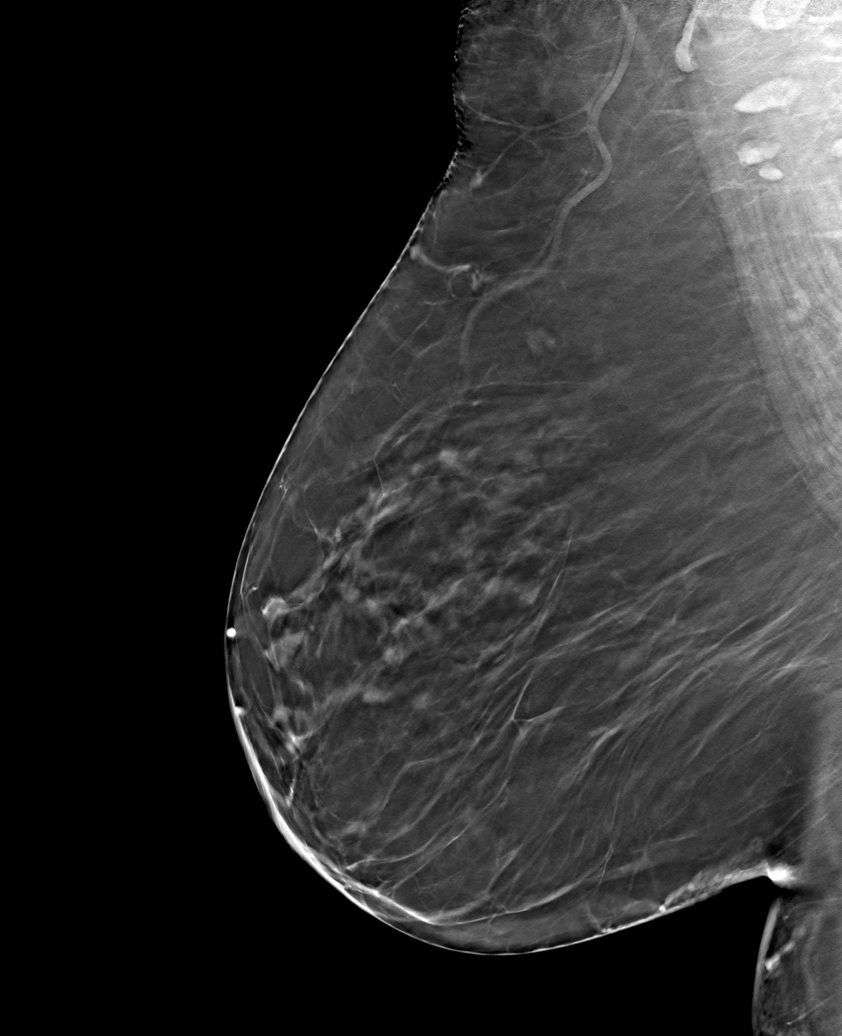

[6 of 30 positions shown; findings below may reference images not displayed]

ACR Breast Density Category b: There are scattered areas of
fibroglandular density.
FINDINGS: There are no findings suspicious for malignancy.
IMPRESSION: No mammographic evidence of malignancy. A result letter of this
screening mammogram will be mailed directly to the patient.

RECOMMENDATION:
Screening mammogram in one year. (Code:51-O-LD2)

BI-RADS CATEGORY  1: Negative.

## 2023-08-31 ENCOUNTER — Other Ambulatory Visit: Payer: Self-pay | Admitting: Family Medicine

## 2023-08-31 DIAGNOSIS — Z1231 Encounter for screening mammogram for malignant neoplasm of breast: Secondary | ICD-10-CM

## 2023-10-12 ENCOUNTER — Ambulatory Visit
Admission: RE | Admit: 2023-10-12 | Discharge: 2023-10-12 | Disposition: A | Discharge status: Home / Self Care | Payer: 59 | Source: Ambulatory Visit | Attending: Family Medicine | Admitting: Family Medicine

## 2023-10-12 DIAGNOSIS — Z1231 Encounter for screening mammogram for malignant neoplasm of breast: Secondary | ICD-10-CM | POA: Diagnosis present

## 2024-06-25 ENCOUNTER — Other Ambulatory Visit: Payer: Self-pay | Admitting: Family Medicine

## 2024-06-25 DIAGNOSIS — Z1231 Encounter for screening mammogram for malignant neoplasm of breast: Secondary | ICD-10-CM

## 2024-08-26 ENCOUNTER — Telehealth: Payer: Self-pay

## 2024-08-26 ENCOUNTER — Telehealth: Payer: Self-pay | Admitting: Physician Assistant

## 2024-08-26 ENCOUNTER — Ambulatory Visit
Admission: EM | Admit: 2024-08-26 | Discharge: 2024-08-26 | Disposition: A | Discharge status: Home / Self Care | Attending: Physician Assistant | Admitting: Physician Assistant

## 2024-08-26 DIAGNOSIS — R051 Acute cough: Secondary | ICD-10-CM | POA: Insufficient documentation

## 2024-08-26 DIAGNOSIS — J019 Acute sinusitis, unspecified: Secondary | ICD-10-CM | POA: Diagnosis not present

## 2024-08-26 DIAGNOSIS — R6889 Other general symptoms and signs: Secondary | ICD-10-CM | POA: Diagnosis not present

## 2024-08-26 DIAGNOSIS — J029 Acute pharyngitis, unspecified: Secondary | ICD-10-CM | POA: Insufficient documentation

## 2024-08-26 LAB — RESP PANEL BY RT-PCR (FLU A&B, COVID) ARPGX2
Influenza A by PCR: NEGATIVE
Influenza B by PCR: NEGATIVE
SARS Coronavirus 2 by RT PCR: NEGATIVE

## 2024-08-26 LAB — POCT RAPID STREP A (OFFICE): Rapid Strep A Screen: NEGATIVE

## 2024-08-26 MED ORDER — BENZONATATE 200 MG PO CAPS: 200.0000 mg | ORAL_CAPSULE | Freq: Three times a day (TID) | ORAL | 0 refills | Status: AC | PRN

## 2024-08-26 MED ORDER — AMOXICILLIN-POT CLAVULANATE 875-125 MG PO TABS: 1.0000 | ORAL_TABLET | Freq: Two times a day (BID) | ORAL | 0 refills | Status: AC

## 2024-08-26 MED ORDER — PSEUDOEPH-BROMPHEN-DM 30-2-10 MG/5ML PO SYRP: 10.0000 mL | ORAL_SOLUTION | Freq: Four times a day (QID) | ORAL | 0 refills | Status: AC | PRN

## 2024-08-26 MED ORDER — IPRATROPIUM BROMIDE 0.06 % NA SOLN: 2.0000 | Freq: Four times a day (QID) | NASAL | 0 refills | Status: AC

## 2024-08-26 NOTE — Telephone Encounter (Signed)
 Negative COVID and flu.  Patient advised she likely has a sinus infection since she has been symptomatic for couple weeks.  Sent Augmentin  to pharmacy.

## 2024-08-26 NOTE — Discharge Instructions (Signed)
-   Negative strep. - Pending COVID and flu testing.  Will contact you if any positive results and send appropriate medication. - If COVID and flu testing are negative I will send you an antibiotic for a sinus infection. - I sent a nasal spray and cough medication.  Increase rest and fluids.  I will contact you shortly.

## 2024-08-26 NOTE — ED Triage Notes (Signed)
 Pt c/o nausea,chills & bodyaches x1 day. Denies fevers. Has tried OTC meds w/o relief.

## 2024-08-26 NOTE — ED Provider Notes (Signed)
 " [facility]    CSN: [id number] Arrival date & time: 08/26/24  9188      History   Chief Complaint Chief Complaint  Patient presents with   Fever   Chills   Generalized Body Aches    HPI Sabrina Gutierrez is a 64 y.o. female presenting for 1-2-day history flulike symptoms.  Reports increased fatigue, body aches, chills and feeling feverish.  She states she has had slight cough and nasal congestion with sinus pressure x 2 weeks.  States it feels like a normal cold but now she is feeling worse and thinks she might have the flu.  She reports a history of allergic bronchitis and sinusitis.  Reports she has been taking her antihistamines and using inhalers including Breo and albuterol  as prescribed.  Also uses nasal sprays.  Recorded fever, chest pain, wheezing or shortness of breath.  No vomiting or diarrhea.  No sick contacts or known exposure to COVID or flu.  Fully vaccinated for COVID and flu.  History of diabetes, hypertension, and allergies.  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      Home Medications    Prior to Admission medications  Medication Sig Start Date End Date Taking? Authorizing Provider  benzonatate  (TESSALON ) 200 MG capsule Take 1 capsule (200 mg total) by mouth 3 (three) times daily as needed. 08/26/24  Yes Arvis Huxley B, PA-C  brompheniramine-pseudoephedrine-DM 30-2-10 MG/5ML syrup Take 10 mLs by mouth 4 (four) times daily as needed for up to 7 days. 08/26/24 09/02/24 Yes Arvis Huxley B, PA-C  ipratropium (ATROVENT ) 0.06 % nasal spray Place 2 sprays into both nostrils 4 (four) times daily. 08/26/24  Yes Arvis Huxley B, PA-C  ADVAIR DISKUS 250-50 MCG/DOSE AEPB Inhale 1 puff into the lungs 2 (two) times daily. 02/16/20   [provider]  albuterol  (VENTOLIN  HFA) 108 (90 Base) MCG/ACT inhaler Inhale 1-2 puffs into the lungs every 6 (six) hours as needed for wheezing or shortness of breath. 04/26/20 08/21/21  Arvis Huxley B, PA-C  amoxicillin -clavulanate (AUGMENTIN ) 875-125 MG tablet Take 1 tablet by mouth every 12 (twelve) hours for 7 days. 08/26/24 09/02/24  Arvis Huxley NOVAK, PA-C  aspirin 81 MG EC tablet Take 1 tablet by mouth daily.    [provider]  azelastine  (ASTELIN ) 0.1 % nasal spray Place 1 spray into both nostrils 2 (two) times daily. Use in each nostril as directed 04/26/20 08/21/21  Arvis Huxley B, PA-C  BIOTIN PO Take by mouth.    [provider]  Calcium Carb-Cholecalciferol (CALCIUM 600/VITAMIN D3  PO) Take by mouth.    [provider]  ELDERBERRY PO Take 50 mg by mouth daily.    [provider]  fexofenadine (ALLEGRA) 180 MG tablet Take 1 tablet by mouth daily. 05/25/16   [provider]  fluticasone (FLONASE) 50 MCG/ACT nasal spray Place 1 spray into both nostrils 2 (two) times daily. 02/16/20   [provider]  glipiZIDE (GLUCOTROL XL) 10 MG 24 hr tablet Take 1 tablet by mouth 2 (two) times daily. 01/12/17 08/21/21  [provider]  Glucosamine-Chondroitin  (GLUCOSAMINE CHONDR COMPLEX PO) Take 1 tablet by mouth daily.    [provider]  insulin glargine (LANTUS SOLOSTAR) 100 UNIT/ML Solostar Pen Inject 24 Units into the skin at bedtime. 07/19/15 08/21/21  [provider]  losartan (COZAAR) 25 MG tablet Take 25 mg by mouth daily. 04/13/20   [provider]  LUTEIN PO Take by mouth.    [provider]  metFORMIN (GLUCOPHAGE) 500 MG tablet Take one tablet by mouth in the am, 2 tablets by mouth in the afternoon, and one tablet by mouth in the pm 01/12/17   [provider]  montelukast  (SINGULAIR ) 10 MG tablet Take 1 tablet (10 mg total) by mouth at bedtime. 04/26/20 05/26/20  Arvis Huxley B, PA-C  Multiple Vitamin (MULTI-VITAMIN) tablet Take 1 tablet by mouth daily.    [provider]  pravastatin (PRAVACHOL) 80 MG tablet Take 80 mg by mouth daily. 04/13/20   [provider]  SEMGLEE, YFGN, 100 UNIT/ML Pen Inject into the skin. 08/17/21   [provider]  Turmeric Curcumin 500 MG CAPS Take by mouth.    [provider]    Family History Family History  Problem Relation Age of Onset   Stomach cancer Mother    Diabetes Mother    Hypertension Mother    Diabetes Father    Breast cancer Neg Hx     Social History Social History   Tobacco Use   Smoking status: Never   Smokeless tobacco: Never  Vaping Use   Vaping status: Never Used  Substance Use Topics   Alcohol use: Yes    Comment: rare   Drug use: Never     Allergies   Lisinopril and Molds & smuts   Review of Systems Review of Systems  Constitutional:  Positive for chills, fatigue and fever (subjective). Negative for diaphoresis.  HENT:  Positive for congestion, postnasal drip, rhinorrhea, sinus pressure, sinus pain and sore throat. Negative for ear pain.   Respiratory:  Positive for cough. Negative for shortness of breath and wheezing.   Cardiovascular:  Negative for chest pain.  Gastrointestinal:  Negative  for abdominal pain, nausea and vomiting.  Musculoskeletal:  Positive for myalgias.  Skin:  Negative for rash.  Neurological:  Positive for headaches. Negative for weakness.  Hematological:  Negative for adenopathy.     Physical Exam Triage Vital Signs ED Triage Vitals  Enc Vitals Group     BP      Pulse      Resp      Temp      Temp src      SpO2      Weight      Height      Head Circumference      Peak Flow      Pain Score      Pain Loc      Pain Edu?      Excl. in [place]?    No  data found.  Updated Vital Signs BP 124/79 (BP Location: Left Arm)   Pulse 77   Temp 97.8 F (36.6 C) (Oral)   Resp 18   Wt 148 lb 14.4 oz (67.5 kg)   SpO2 98%   BMI 25.16 kg/m      Physical Exam Vitals and nursing note reviewed.  Constitutional:      General: She is not in acute distress.    Appearance: Normal appearance. She is not ill-appearing or toxic-appearing.  HENT:     Head: Normocephalic and atraumatic.     Right Ear: Tympanic membrane, ear canal and external ear normal.     Left Ear: Tympanic membrane, ear canal and external ear normal.     Nose: Congestion and rhinorrhea present.     Mouth/Throat:     Mouth: Mucous membranes are moist.     Pharynx: Oropharynx is clear. Posterior oropharyngeal erythema (mild with PND) present.  Eyes:     General: No scleral icterus.       Right eye: No discharge.        Left eye: No discharge.     Conjunctiva/sclera: Conjunctivae normal.  Cardiovascular:     Rate and Rhythm: Normal rate and regular rhythm.     Heart sounds: Normal heart sounds.  Pulmonary:     Effort: Pulmonary effort is normal. No respiratory distress.     Breath sounds: Normal breath sounds. No wheezing, rhonchi or rales.  Musculoskeletal:     Cervical back: Neck supple.  Skin:    General: Skin is dry.  Neurological:     General: No focal deficit present.     Mental Status: She is alert. Mental status is at baseline.     Motor: No weakness.     Coordination:  Coordination normal.     Gait: Gait normal.  Psychiatric:        Mood and Affect: Mood normal.        Behavior: Behavior normal.      UC Treatments / Results  Labs (all labs ordered are listed, but only abnormal results are displayed) Labs Reviewed  POCT RAPID STREP A (OFFICE) - Normal  RESP PANEL BY RT-PCR (FLU A&B, COVID) [id number]    EKG   Radiology No results found.  Procedures Procedures (including critical care time)  Medications Ordered in UC Medications - No data to display  Initial Impression / Assessment and Plan / UC Course  I have reviewed the triage vital signs and the nursing notes.  Pertinent labs & imaging results that were available during my care of the patient were reviewed by me and considered in my medical decision making (see chart for details).  64 year old female with history of allergic rhinitis/sinusitis/bronchitis presenting for 1-2-day history of feeling feverish with flulike symptoms.  Reports congestion and cough x 2 weeks with sinus pressure and sore throat.    She is afebrile.  She is overall well-appearing.  On exam she has nasal congestion. Mild erythema of throat.  Chest is clear to auscultation and heart regular rate and rhythm.    Rapid strep negative.  Send out COVID/flu PCR testing with pending results.  Advised patient she will be contacted with these results.  If positive for flu or COVID we can consider antiviral treatment.  If negative I will treat for suspected sinusitis.  Reviewed current CDC guidelines, isolation protocol and ED precautions if COVID test is positive.  Supportive care encouraged with increasing rest and fluids and continuing inhalers, nasal  sprays, antihistamines and decongestants.  Sent benzonatate  for cough at patient request.  Reviewed ED precautions.  Negative COVID and flu testing.  Result communicated to patient by nursing staff.  Acute sinusitis.  Sent Augmentin  to pharmacy.  Final Clinical Impressions(s)  / UC Diagnoses   Final diagnoses:  Sore throat  Flu-like symptoms  Acute cough  Acute sinusitis, recurrence not specified, unspecified location     Discharge Instructions      - Negative strep. - Pending COVID and flu testing.  Will contact you if any positive results and send appropriate medication. - If COVID and flu testing are negative I will send you an antibiotic for a sinus infection. - I sent a nasal spray and cough medication.  Increase rest and fluids.  I will contact you shortly.      ED Prescriptions     Medication Sig Dispense Auth. Provider   ipratropium (ATROVENT ) 0.06 % nasal spray Place 2 sprays into both nostrils 4 (four) times daily. 15 mL Arvis Huxley B, PA-C   brompheniramine-pseudoephedrine-DM 30-2-10 MG/5ML syrup Take 10 mLs by mouth 4 (four) times daily as needed for up to 7 days. 150 mL Arvis Huxley B, PA-C   benzonatate  (TESSALON ) 200 MG capsule Take 1 capsule (200 mg total) by mouth 3 (three) times daily as needed. 30 capsule Arvis Huxley NOVAK, PA-C      PDMP not reviewed this encounter.      Arvis Huxley NOVAK, PA-C 08/26/24 1226  "

## 2024-08-26 NOTE — Telephone Encounter (Signed)
 Received call from pt in regards to her PCR covid/flu swab. Informed pt that results were neg & that [place] PA sent Augmentin  to [facility] in [place] to tx for sinus infection.  Pt verbalized understanding to info.

## 2024-08-26 NOTE — Telephone Encounter (Signed)
 Called patient to inform of abx. LVM with call back number

## 2024-10-31 ENCOUNTER — Encounter
